# Patient Record
Sex: Male | Born: 1970 | Race: White | Hispanic: No | Marital: Married | Smoking: Never smoker
Health system: Southern US, Community
[De-identification: ages and names within clinical notes are randomized; demographics above are authoritative.]

---

## 2016-01-12 ENCOUNTER — Ambulatory Visit (INDEPENDENT_AMBULATORY_CARE_PROVIDER_SITE_OTHER): Payer: BLUE CROSS/BLUE SHIELD | Admitting: Sports Medicine

## 2016-01-12 ENCOUNTER — Encounter: Payer: Self-pay | Admitting: Sports Medicine

## 2016-01-12 DIAGNOSIS — S76011A Strain of muscle, fascia and tendon of right hip, initial encounter: Secondary | ICD-10-CM | POA: Insufficient documentation

## 2016-01-12 DIAGNOSIS — S448X2A Injury of other nerves at shoulder and upper arm level, left arm, initial encounter: Secondary | ICD-10-CM | POA: Diagnosis not present

## 2016-01-12 NOTE — Progress Notes (Signed)
Chief complaint -- left scapular pain  Patient is a regular weightlifter In 2015 he had biceps tenodesis to the left shoulder by Dr. Carney LivingSpeer Had done very well and he returned to normal lifting Over the past year he feels that he gets sharp pains around his left shoulder blade This is worse with overhead lifts He finds left shoulder rotates forward and clicks at times Pain will sometimes bother him  With computer work Pain usually comes today after he lifts weights  Social history nonsmoker  Review of systems Neck feels tight at times but no pain No radicular symptoms into the left or right arm Occasional night pain  Gen. Muscular white male in no acute distress BP 136/87   Ht 5\' 10"  (1.778 m)   Wt 217 lb (98.4 kg)   BMI 31.14 kg/m   Shoulder: Inspection reveals no abnormalities, atrophy or asymmetry. Palpation is normal with no tenderness over AC joint or bicipital groove. ROM is full in all planes. Rotator cuff strength normal throughout. No signs of impingement with negative Neer and Hawkin's tests, empty can. Speeds and Yergason's tests normal. No labral pathology noted with negative Obrien's, negative clunk and good stability. Excess motion of left scapula observed With repeated movement No painful arc and no drop arm sign. No apprehension sign  Scapular stabilization sign reduces symptoms  When he is abducted and internally rotated he demonstrates weakness on external rotation  Positive Tinels sign with brachial plexus  Neck evaluation was unremarkable  Ultrasound of left shoulder Rotator cuff tendons were evaluated and all were normal Biceps tendon appears normal and is attached about 2 cm below the rotator cuff A.c. Joint is unremarkable No cyst noted at the supraglenoid notch  Impression was an unremarkable shoulder ultrasound  Ultrasound and interpretation by Sibyl ParrKarl B. Darrick PennaFields, MD

## 2016-01-12 NOTE — Assessment & Plan Note (Signed)
I gave him a series of hip abduction exercises  He has significant weakness today  Reevaluate in 8 weeks

## 2016-01-12 NOTE — Assessment & Plan Note (Signed)
I given him a series of scapular stabilization exercises Modify posture Change position when working at the computer Caution with any overhead lifts  Try this and reevaluate in 2 months If not responding we will consider trying gabapentin

## 2016-03-14 ENCOUNTER — Ambulatory Visit: Payer: BLUE CROSS/BLUE SHIELD | Admitting: Sports Medicine

## 2016-03-15 ENCOUNTER — Ambulatory Visit: Payer: BLUE CROSS/BLUE SHIELD | Admitting: Sports Medicine

## 2016-03-16 ENCOUNTER — Ambulatory Visit: Payer: BLUE CROSS/BLUE SHIELD | Admitting: Sports Medicine

## 2016-03-30 ENCOUNTER — Ambulatory Visit (INDEPENDENT_AMBULATORY_CARE_PROVIDER_SITE_OTHER): Payer: BLUE CROSS/BLUE SHIELD | Admitting: Sports Medicine

## 2016-03-30 ENCOUNTER — Encounter: Payer: Self-pay | Admitting: Sports Medicine

## 2016-03-30 VITALS — BP 117/92 | HR 82 | Ht 71.0 in | Wt 217.0 lb

## 2016-03-30 DIAGNOSIS — M25512 Pain in left shoulder: Secondary | ICD-10-CM

## 2016-03-30 DIAGNOSIS — S448X2A Injury of other nerves at shoulder and upper arm level, left arm, initial encounter: Secondary | ICD-10-CM

## 2016-03-30 MED ORDER — CYCLOBENZAPRINE HCL 10 MG PO TABS
10.0000 mg | ORAL_TABLET | Freq: Every evening | ORAL | 0 refills | Status: DC | PRN
Start: 2016-03-30 — End: 2016-07-06

## 2016-03-30 NOTE — Progress Notes (Signed)
  Subjective:    William Padilla is a 46 y.o. old male here for follow up on right shoulder blade pain.  HPI Pain over right shoulder blade: reports some improvement with the exercise regiemen he was given when he was last seen here. He was doing fine until a month ago when he had a setback doing overhead lifting. He reports feeling brief pain over the medial border of the scapula and upper thoracic spine. He continued to feel this pain intermittently. Felt pain driving with that arm. He went to chiropractors about 2 weeks ago. He goes to chiropractics every month. He hasn't seen much improvement with this. He tried naproxen intermittently without improvement either. He works on the computer most of the days. He does some stretching intermittently at work.  Overall, he reports some improvement - particularly in strength - since he was here last time.  PMH/Problem List: has Suprascapular nerve injury, left, initial encounter and Strain of gluteus medius of right lower extremity on his problem list.   has no past medical history on file.  FH:  No family history on file.  SH Social History  Substance Use Topics  . Smoking status: Never Smoker  . Smokeless tobacco: Never Used  . Alcohol use Not on file    Review of Systems Review of systems negative except for pertinent positives and negatives in history of present illness above.     Objective:     Vitals:   03/30/16 0915  BP: (!) 117/92  Pulse: 82  Weight: 217 lb (98.4 kg)  Height: 5\' 11"  (1.803 m)    Physical Exam GEN: appears well, no apparent distress. RESP: speaks in full sentence, no IWOB MSK:  Shoulder: No bony deformities, inflammation/apparent skin lesion, or tenderness in rotator cuff, biceps tendon, or acromioclavicular joint. Full ROM and strength in shoulder upon adduction, abduction, internal and external rotation.  Continued to have bilateral shoulder protraction.  Felt some pain with left shoulder abduction that has  improved with scapular stabilization.  No impingement sign. Empty can and drop arm tests were negative.  SKIN: no apparent skin lesion PSYCH: euthymic mood with congruent affect     Assessment and Plan:  Suprascapular nerve injury, left, initial encounter Improved except for a mild setback he had with overhead lifting about a month ago. Advised to be cautious with overhead lifting. Recommended continuing scapular stabilization and H&T exercises. Gave prescription for Flexeril 10 mg at bedtime as needed pain. Follow-up in 3 months.   Return in about 3 months (around 06/30/2016) for Follow up on shoulder pain.  William Herculesaye T Gonfa, MD 03/30/16 Pager: [phone]  I observed and examined the patient with the resident and agree with assessment and plan.  Note reviewed and modified by me. William BaasKarl Fields, MD

## 2016-03-30 NOTE — Patient Instructions (Addendum)
It is nice seeing you today! It seems your shoulder pain and strength have improved.  The followings are our recommendation going forward: 1. Continue scapular stabilization exercises 2. You can try H&T exercises as we demonstrated to you 3. Fill the prescription for Flexeril and try at bedtime if you continues to have pain.  4. Be cautious with overhead lifting exercises  Follow up in three months.

## 2016-03-30 NOTE — Assessment & Plan Note (Signed)
Improved except for a mild setback he had with overhead lifting about a month ago. Advised to be cautious with overhead lifting. Recommended continuing scapular stabilization and H&T exercises. Gave prescription for Flexeril 10 mg at bedtime as needed pain. Follow-up in 3 months.

## 2016-07-06 ENCOUNTER — Telehealth: Payer: Self-pay | Admitting: Student

## 2016-07-06 ENCOUNTER — Ambulatory Visit
Admission: RE | Admit: 2016-07-06 | Discharge: 2016-07-06 | Disposition: A | Discharge status: Home / Self Care | Payer: BLUE CROSS/BLUE SHIELD | Source: Ambulatory Visit | Attending: Sports Medicine | Admitting: Sports Medicine

## 2016-07-06 ENCOUNTER — Encounter: Payer: Self-pay | Admitting: Sports Medicine

## 2016-07-06 ENCOUNTER — Ambulatory Visit (INDEPENDENT_AMBULATORY_CARE_PROVIDER_SITE_OTHER): Payer: BLUE CROSS/BLUE SHIELD | Admitting: Sports Medicine

## 2016-07-06 DIAGNOSIS — G2589 Other specified extrapyramidal and movement disorders: Secondary | ICD-10-CM

## 2016-07-06 DIAGNOSIS — M542 Cervicalgia: Secondary | ICD-10-CM

## 2016-07-06 MED ORDER — AMITRIPTYLINE HCL 25 MG PO TABS: 25.0000 mg | ORAL_TABLET | Freq: Every day | ORAL | 1 refills | Status: AC

## 2016-07-06 NOTE — Telephone Encounter (Signed)
Called pt, verified name and DOB.  Reviewed neck x-rays, which were normal.  Recommended continuing plan and follow up if not improved in 6 weeks.  Signed,  Corliss MarcusAlicia Barnes, DO [facility] 4:23 PM

## 2016-07-06 NOTE — Assessment & Plan Note (Signed)
Continue HEP, much improved from last visit

## 2016-07-06 NOTE — Progress Notes (Signed)
  William PulseJames Padilla - 46 y.o. male MRN [id number]  Date of birth: 04/16/1970  SUBJECTIVE:  Including CC & ROS.  CC: left shoulder pain, upper back pain, neck pain   Presents in follow-up for left shoulder pain, or back pain, neck pain. He reports that his left shoulder is doing much better after his home exercise program.  He uses Flexeril as needed and states that it takes the edge off. While he is doing his home exercise program, he feels as though his upper back has been acting up. He states that it is feeling really tight and it is on his left side more than his right he denies any radicular pain, numbness, tingling, weakness. He also complains of pain in his neck that is been ongoing for the past few months. Upper back pain has been ongoing for the past 6 weeks.  ROS: No unexpected weight loss, fever, chills, swelling, instability, muscle pain, numbness/tingling, redness, otherwise see HPI   PMHx - Updated and reviewed.  Contributory factors include: hypothyroidism PSHx - Updated and reviewed.  Contributory factors include:  Negative FHx - Updated and reviewed.  Contributory factors include:  Negative Social Hx - Updated and reviewed. Contributory factors include: Negative Medications - reviewed, takes supplements  DATA REVIEWED: Previous office visits- suprascapular nerve injury  PHYSICAL EXAM:  VS: BP:110/70  HR: bpm  TEMP: ( )  RESP:   HT:5\' 11"  (180.3 cm)   WT:217 lb (98.4 kg)  BMI:30.3 PHYSICAL EXAM: Gen: NAD, alert, cooperative with exam, well-appearing HEENT: clear conjunctiva,  CV:  no edema, capillary refill brisk, normal rate Resp: non-labored Skin: no rashes, normal turgor  Neuro: no gross deficits.  Psych:  alert and oriented Neck: Inspection unremarkable. No palpable stepoffs. TTP over L>R trapezius Negative Spurling's maneuver. Full neck range of motion Grip strength and sensation normal in bilateral hands Strength good C4 to T1 distribution No sensory change  to C4 to T1 Negative Hoffman sign bilaterally Reflexes normal  TTP at paraspinals L>R in upper back  Shoulder: Inspection reveals no abnormalities, atrophy or asymmetry. Palpation is normal with no tenderness over AC joint or bicipital groove. ROM is full in all planes. Rotator cuff strength normal throughout. No signs of impingement with negative Neer and Hawkin's tests, empty can sign. Speeds and Yergason's tests normal. No labral pathology noted with negative Obrien's, negative clunk and good stability. Normal scapular function observed, but does have slightly protruding inferior base of left scapula. No painful arc and no drop arm sign. No apprehension sign     ASSESSMENT & PLAN:   Neck pain Will check neck x-rays, start amitriptyline at night.  Will call with results.  Follow up 2 months to reassess  Scapular dyskinesis Continue HEP, much improved from last visit

## 2016-07-06 NOTE — Telephone Encounter (Signed)
Called pt and left message to call back to review x-rays.  If calls back, x-rays negative.  Would recommend continuing plan with amitriptyline and HEP.  If not improved in 6-8 weeks, would like to see in office again to reassess.  May consider MRI at that time.  Signed,  Corliss MarcusAlicia Barnes, DO [facility] 3:34 PM

## 2016-07-06 NOTE — Assessment & Plan Note (Addendum)
Will check neck x-rays, start amitriptyline at night.  Will call with results.  Follow up 2 months to reassess

## 2016-09-14 ENCOUNTER — Ambulatory Visit: Payer: BLUE CROSS/BLUE SHIELD | Admitting: Sports Medicine

## 2016-10-05 ENCOUNTER — Ambulatory Visit (INDEPENDENT_AMBULATORY_CARE_PROVIDER_SITE_OTHER): Payer: BLUE CROSS/BLUE SHIELD | Admitting: Sports Medicine

## 2016-10-05 DIAGNOSIS — S448X2A Injury of other nerves at shoulder and upper arm level, left arm, initial encounter: Secondary | ICD-10-CM | POA: Diagnosis not present

## 2016-10-05 MED ORDER — CYCLOBENZAPRINE HCL 10 MG PO TABS: ORAL_TABLET | ORAL | 1 refills | Status: AC

## 2016-10-05 NOTE — Patient Instructions (Addendum)
Complete the following exercises daily:  Arm swings by your side 3 x 30  Easy neck motion in all directions  Shoulder shrugs 3 x 30  Complete seated upper body lift with dumbbells and neck supported for the next few weeks.

## 2016-10-06 NOTE — Assessment & Plan Note (Signed)
I think this is recurrent Must avoid head position forward with lifting Avoid weight bar on back of neck No overhead press  Flexeril 10 up to tid  If not better in 4 wks consider neck MRI  Keep up scap exer

## 2016-10-06 NOTE — Progress Notes (Signed)
CC: left shoulder pain  Patient increased weight from 120 to 180 lbs on squat Holds bar behind neck Now has persistent pain into left shoulder, trap and shoulder blade He has had this off and on for years Still doing scap stabilization exercises and better posture XR of neck did not show DJD  Pain now is stopping him from working out In past flexeril helped NSAIDs and pain med did not  ROS No numbness or tingling into hand No weakness in left arm  PE Muscular w M in NAD BP 124/74   Ht  (1.778 m)   Wt 215 lb (97.5 kg)   BMI 30.85 kg/m   Neck: full ROM but pain with: Left lateral bend With full flexion With left rotation  RC strength is good throughout Full ROM RT shoulder  Mild winging of left scapula and crepitation with motion Spasm left trapezius  DTR - normal left arm/ diminished at triceps

## 2017-03-27 ENCOUNTER — Encounter: Payer: Self-pay | Admitting: Sports Medicine

## 2017-03-27 ENCOUNTER — Ambulatory Visit (INDEPENDENT_AMBULATORY_CARE_PROVIDER_SITE_OTHER): Payer: BLUE CROSS/BLUE SHIELD | Admitting: Sports Medicine

## 2017-03-27 VITALS — BP 110/72 | Ht 70.0 in | Wt 215.0 lb

## 2017-03-27 DIAGNOSIS — S46811A Strain of other muscles, fascia and tendons at shoulder and upper arm level, right arm, initial encounter: Secondary | ICD-10-CM | POA: Diagnosis not present

## 2017-03-27 DIAGNOSIS — S448X2S Injury of other nerves at shoulder and upper arm level, left arm, sequela: Secondary | ICD-10-CM | POA: Diagnosis not present

## 2017-03-27 NOTE — Progress Notes (Signed)
Chief complaint: Follow-up of left shoulder pain 6-8 months; new right shoulder pain x 2 months  History of present illness: Avik is a 47 year old right-hand dominant male presents to the sports medicine office today for follow-up of left shoulder pain.  He was last seen here back in September 2018.  He had left shoulder pain, really noticed pain when he does weights and holds the bar behind his neck.  He reports pain is in the posterior aspect of his left shoulder radiating down to the left trapezius and the scapula.  At last appointment, he was found to have scapular winging.  Ultimately, symptoms were found to be related to suprascapular nerve impingement and stretching. Flexeril was prescribed and he was requested to follow-up his symptoms did not improve and neck MRI would be considered.  He was also discussed to do some scapular exercises.  He reports that symptoms are improved today, but still reports occurrence of pain when holding weights with the bar behind his neck.  He does not report of any numbness, tingling, or burning paresthesias radiating down his left arm into his left hand and fingers.  He does not report feeling weak in his left shoulder.  He does not report of any shoulder pain specifically. He reports that he used Flexeril for the first couple months, but has not used any medications over the last 2-3 months.  No interval injury or trauma. He reports that he started to get right posterolateral shoulder pain about 2 months ago. He does not report of any inciting incident, trauma, or injury.  He does not report of pain radiating pain down his right arm into his right hand and fingers.  He does not report of any overlying erythema, ecchymosis, or effusion.  He reports any type of extreme abduction or external range of motion causes pain.  Review of systems:  As stated above  Interval past medical history, surgical history, family history, and social history obtained and unchanged. Past  medical history notable for hypothyroidism, does not report of any surgery/hospitalizations, does not report of any current tobacco use, family history non-contributable to orthopedic complaint.  Allergies and medications reviewed, updated in EMR.  Physical exam: Vital signs are reviewed and are documented in the chart Gen.: Alert, oriented, appears stated age, in no apparent distress HEENT: Moist oral mucosa Respiratory: Normal respirations, able to speak in full sentences Cardiac: Regular rate, distal pulses 2+ Integumentary: No rashes on visible skin:  Neurologic: He does have great rotator cuff strength, would categorize rotator cuff strength on both sides that is 5/5, no evidence of scapular winging or scapular retraction, sensation 2+ in bilateral upper extremities Psych: Normal affect, mood is described as good Musculoskeletal: Inspection of left shoulder reveals no obvious deformity or muscle atrophy, no warmth, erythema, ecchymosis, or effusion no tenderness to palpation anywhere in his left shoulder today including the distal clavicle, AC joint, acromion, scapular spine, cervical spine, paracervical spinous processes, he is slightly tender to deep palpation over the posterior lateral aspect of his deltoid, no tenderness over the right distal clavicle, AC joint, acromion biceps, triceps, scapular spine, he does have full shoulder range of motion without any elicitation of pain, rotator cuff impingement testing is negative on the left side, negative on the right side, however, when supraspinatus is isolated he does have minimal signs of impingement and feels pain with resistance  Assessment and plan: 1. Resolving left suprascapular nerve impingement from being overstretched 2. Right supraspinatus tendon strain, with recruitment and  easily fatiguing of the deltoid musculature  Plan: Discussed that his symptoms are improving on the left side as he does not any evidence of scapular winging or  protrusion noted.  Discussed that he does not need to continue Flexeril unless he feels muscle spasm.  Discussed avoiding aggravating activities such as overhead activities with the weight bar.  Discussed some home exercise program for him to do that can focus on forward flexion exercises of the shoulder.  In addition, discussed that he does have some supraspinatus tendon strain issues on the right shoulder that could be resolved with some [place] exercises and doing some strengthening of the supraspinatus and infraspinatus musculature.   Discussed to give this 4-6 weeks.  If no improvement in symptoms return to office.  Otherwise, plan to have him return on as-needed basis.   Haynes Kernshristopher Lake, M.D. Primary Care Sports Medicine Fellow [facility]  I observed and examined the patient with the [facility] resident and agree with assessment and plan.  Note reviewed and modified by me. Enid BaasKarl Fields, MD

## 2017-09-18 DIAGNOSIS — L814 Other melanin hyperpigmentation: Secondary | ICD-10-CM | POA: Diagnosis not present

## 2017-09-18 DIAGNOSIS — L923 Foreign body granuloma of the skin and subcutaneous tissue: Secondary | ICD-10-CM | POA: Diagnosis not present

## 2017-09-18 DIAGNOSIS — L821 Other seborrheic keratosis: Secondary | ICD-10-CM | POA: Diagnosis not present

## 2017-09-18 DIAGNOSIS — D2272 Melanocytic nevi of left lower limb, including hip: Secondary | ICD-10-CM | POA: Diagnosis not present

## 2017-10-08 DIAGNOSIS — M25511 Pain in right shoulder: Secondary | ICD-10-CM | POA: Diagnosis not present

## 2018-01-15 DIAGNOSIS — M25511 Pain in right shoulder: Secondary | ICD-10-CM | POA: Diagnosis not present

## 2018-01-15 DIAGNOSIS — M7521 Bicipital tendinitis, right shoulder: Secondary | ICD-10-CM | POA: Diagnosis not present

## 2018-02-01 DIAGNOSIS — S46011A Strain of muscle(s) and tendon(s) of the rotator cuff of right shoulder, initial encounter: Secondary | ICD-10-CM | POA: Diagnosis not present

## 2018-02-01 DIAGNOSIS — M67911 Unspecified disorder of synovium and tendon, right shoulder: Secondary | ICD-10-CM | POA: Diagnosis not present

## 2018-02-14 DIAGNOSIS — M7541 Impingement syndrome of right shoulder: Secondary | ICD-10-CM | POA: Diagnosis not present

## 2018-03-02 DIAGNOSIS — M25511 Pain in right shoulder: Secondary | ICD-10-CM | POA: Diagnosis not present

## 2018-03-02 DIAGNOSIS — S43401A Unspecified sprain of right shoulder joint, initial encounter: Secondary | ICD-10-CM | POA: Diagnosis not present

## 2018-03-02 DIAGNOSIS — M24111 Other articular cartilage disorders, right shoulder: Secondary | ICD-10-CM | POA: Diagnosis not present

## 2018-03-02 DIAGNOSIS — G8918 Other acute postprocedural pain: Secondary | ICD-10-CM | POA: Diagnosis not present

## 2018-03-02 DIAGNOSIS — M7521 Bicipital tendinitis, right shoulder: Secondary | ICD-10-CM | POA: Diagnosis not present

## 2018-03-02 DIAGNOSIS — M25711 Osteophyte, right shoulder: Secondary | ICD-10-CM | POA: Diagnosis not present

## 2018-03-02 DIAGNOSIS — X58XXXA Exposure to other specified factors, initial encounter: Secondary | ICD-10-CM | POA: Diagnosis not present

## 2018-03-02 DIAGNOSIS — M19011 Primary osteoarthritis, right shoulder: Secondary | ICD-10-CM | POA: Diagnosis not present

## 2018-03-02 DIAGNOSIS — M7541 Impingement syndrome of right shoulder: Secondary | ICD-10-CM | POA: Diagnosis not present

## 2018-03-02 DIAGNOSIS — M75111 Incomplete rotator cuff tear or rupture of right shoulder, not specified as traumatic: Secondary | ICD-10-CM | POA: Diagnosis not present

## 2018-03-06 DIAGNOSIS — M25511 Pain in right shoulder: Secondary | ICD-10-CM | POA: Diagnosis not present

## 2018-03-06 IMAGING — DX DG CERVICAL SPINE COMPLETE 4+V
5 series · 5 of 5 positions shown · non-contrast
Comparison: None in PACs

CLINICAL DATA: Left-sided neck and shoulder pain with hand numbness
since left shoulder surgery 2 years ago. No known injury.

EXAM:
CERVICAL SPINE - COMPLETE 4+ VIEW

[dg cervical spine complete (1 of 5)]
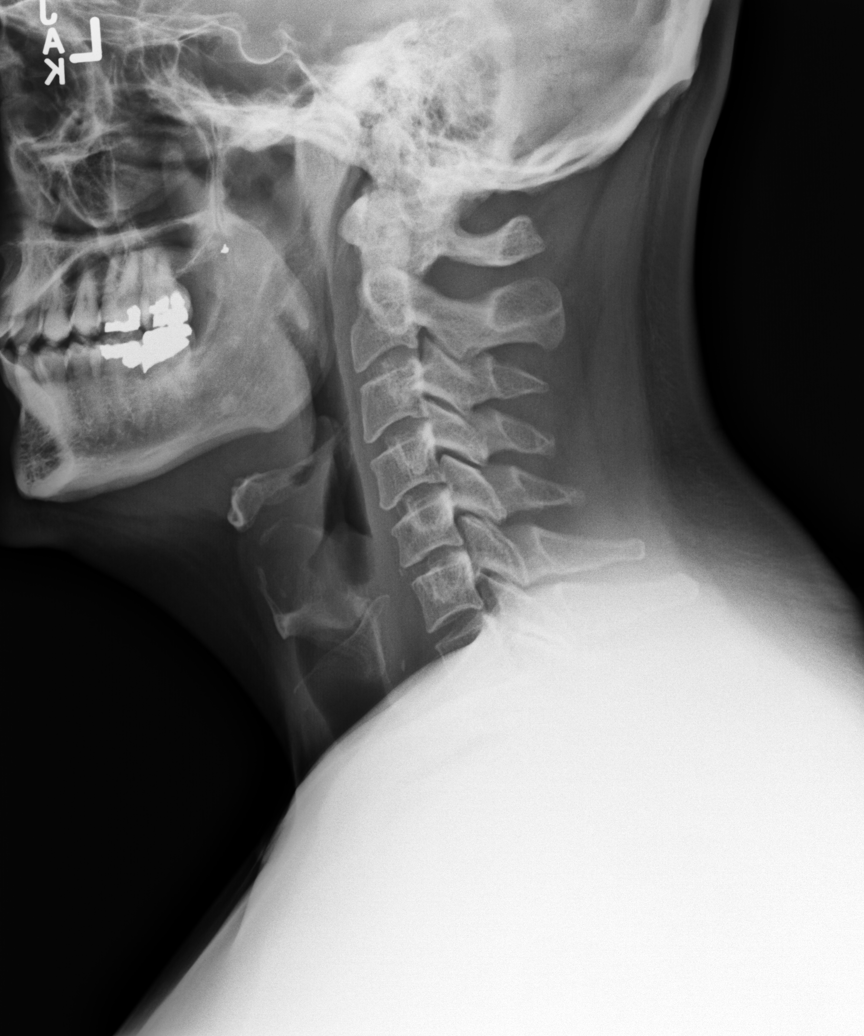

[dg cervical spine complete (2 of 5)]
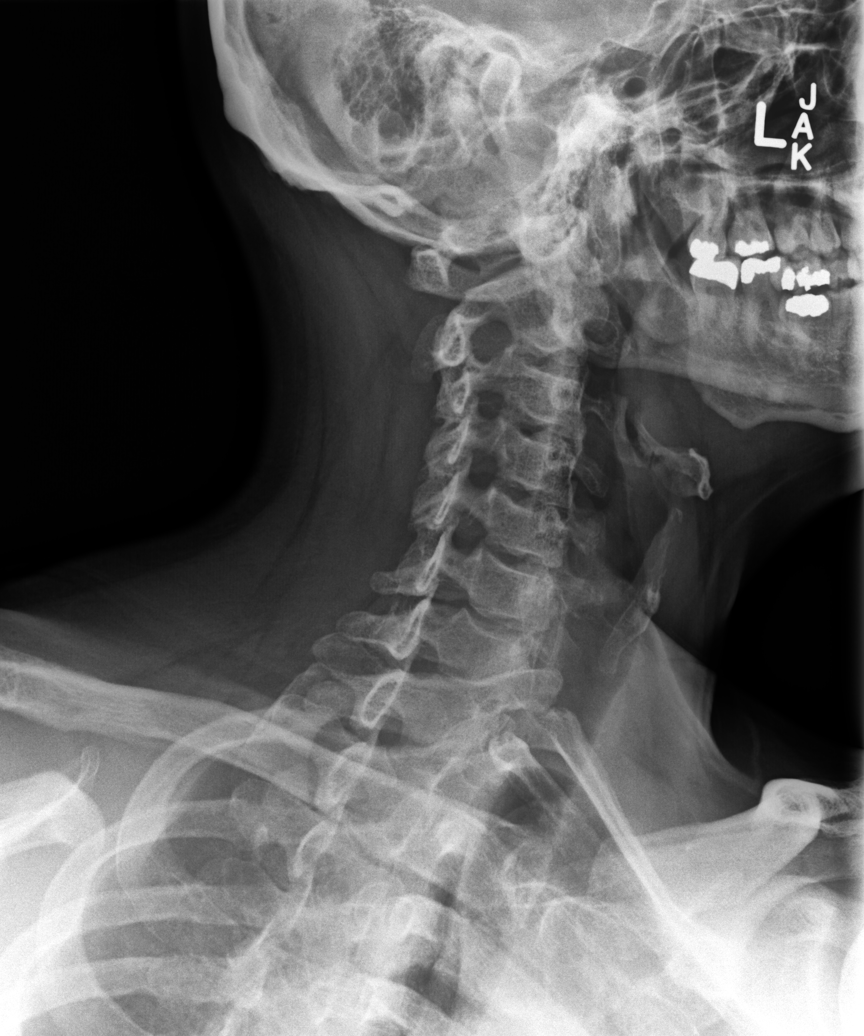

[dg cervical spine complete (3 of 5)]
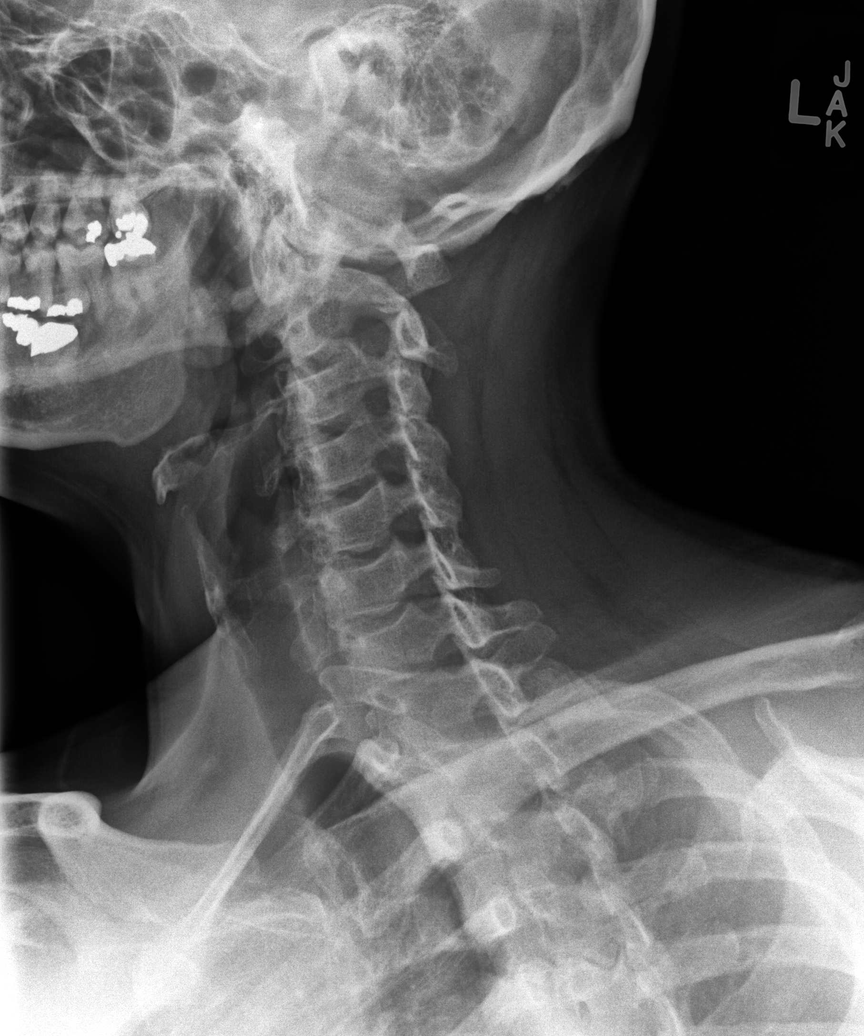

[dg cervical spine complete (4 of 5)]
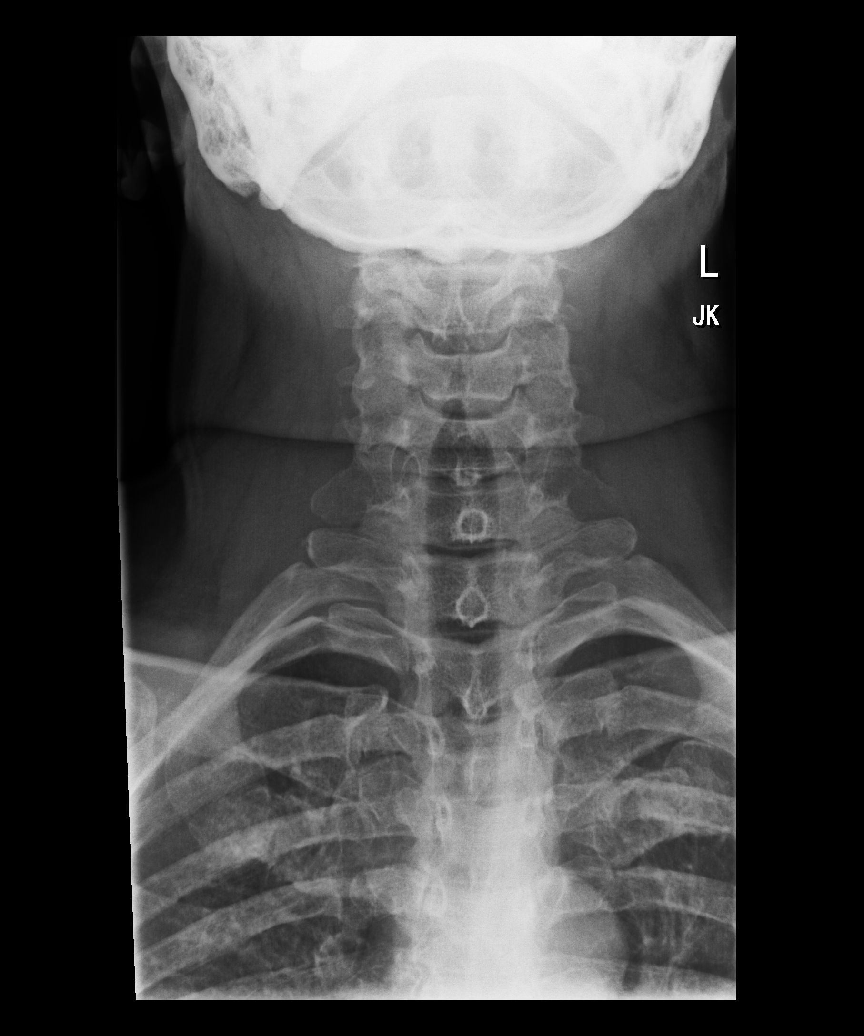

[dg cervical spine complete (5 of 5)]
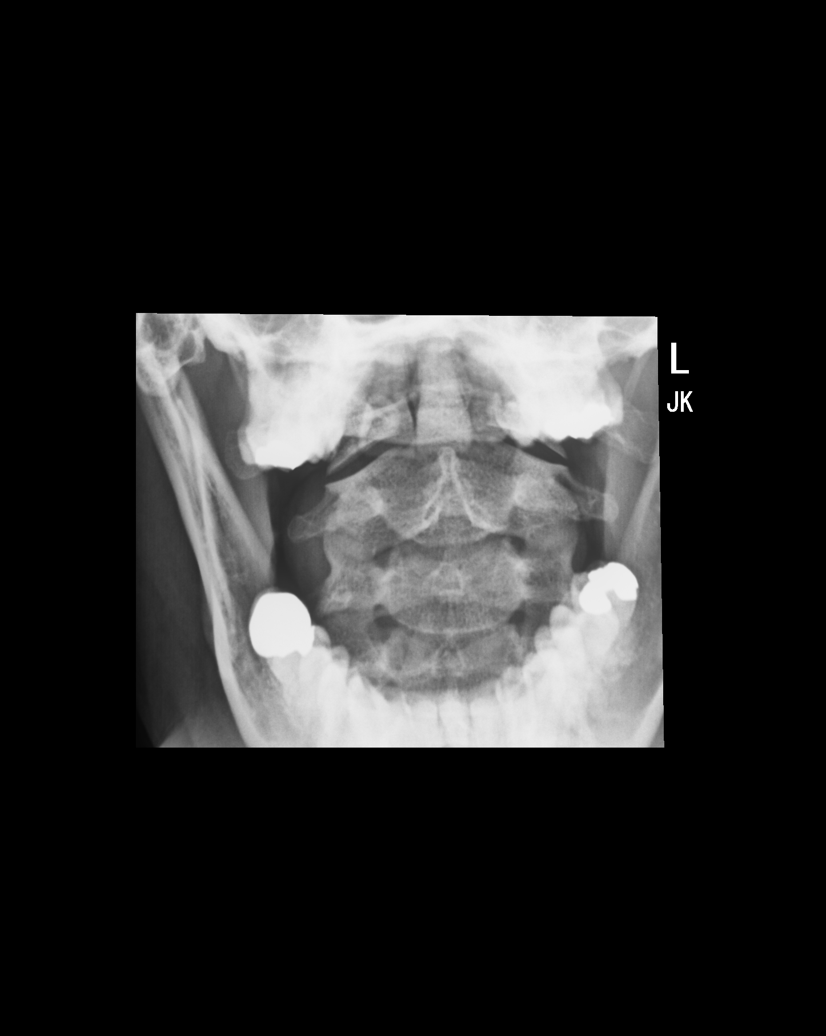

[5 of 5 positions shown; findings below may reference images not displayed]

FINDINGS: The cervical vertebral bodies are preserved in height. The disc
space heights are well maintained. There is no bony encroachment
upon the neural foramina. There is no perched facet. The spinous
processes are normal. The odontoid is intact. The prevertebral soft
tissue spaces are normal.
IMPRESSION: There is no acute or significant chronic bony abnormality of the
cervical spine. If radicular symptoms are suspected clinically,
cervical spine MRI would be a useful next imaging step.

## 2018-03-08 DIAGNOSIS — M25511 Pain in right shoulder: Secondary | ICD-10-CM | POA: Diagnosis not present

## 2018-03-11 DIAGNOSIS — M25511 Pain in right shoulder: Secondary | ICD-10-CM | POA: Diagnosis not present

## 2018-03-13 DIAGNOSIS — M25511 Pain in right shoulder: Secondary | ICD-10-CM | POA: Diagnosis not present

## 2018-03-15 DIAGNOSIS — M25511 Pain in right shoulder: Secondary | ICD-10-CM | POA: Diagnosis not present

## 2018-03-18 DIAGNOSIS — M25511 Pain in right shoulder: Secondary | ICD-10-CM | POA: Diagnosis not present

## 2018-03-20 DIAGNOSIS — M25511 Pain in right shoulder: Secondary | ICD-10-CM | POA: Diagnosis not present

## 2018-03-21 DIAGNOSIS — M25511 Pain in right shoulder: Secondary | ICD-10-CM | POA: Diagnosis not present

## 2018-03-25 DIAGNOSIS — M25511 Pain in right shoulder: Secondary | ICD-10-CM | POA: Diagnosis not present

## 2018-03-27 DIAGNOSIS — M25511 Pain in right shoulder: Secondary | ICD-10-CM | POA: Diagnosis not present

## 2018-03-29 DIAGNOSIS — M25511 Pain in right shoulder: Secondary | ICD-10-CM | POA: Diagnosis not present

## 2018-04-01 DIAGNOSIS — M25511 Pain in right shoulder: Secondary | ICD-10-CM | POA: Diagnosis not present

## 2018-04-03 DIAGNOSIS — M25511 Pain in right shoulder: Secondary | ICD-10-CM | POA: Diagnosis not present

## 2018-04-05 DIAGNOSIS — M25511 Pain in right shoulder: Secondary | ICD-10-CM | POA: Diagnosis not present

## 2018-04-08 DIAGNOSIS — M25511 Pain in right shoulder: Secondary | ICD-10-CM | POA: Diagnosis not present

## 2018-04-10 DIAGNOSIS — M25511 Pain in right shoulder: Secondary | ICD-10-CM | POA: Diagnosis not present

## 2018-04-16 DIAGNOSIS — M25511 Pain in right shoulder: Secondary | ICD-10-CM | POA: Diagnosis not present

## 2018-04-18 DIAGNOSIS — M25511 Pain in right shoulder: Secondary | ICD-10-CM | POA: Diagnosis not present

## 2018-04-24 DIAGNOSIS — M25511 Pain in right shoulder: Secondary | ICD-10-CM | POA: Diagnosis not present

## 2018-05-01 DIAGNOSIS — M25511 Pain in right shoulder: Secondary | ICD-10-CM | POA: Diagnosis not present

## 2018-05-06 DIAGNOSIS — M25511 Pain in right shoulder: Secondary | ICD-10-CM | POA: Diagnosis not present

## 2018-05-20 DIAGNOSIS — M25511 Pain in right shoulder: Secondary | ICD-10-CM | POA: Diagnosis not present

## 2018-08-14 DIAGNOSIS — M1711 Unilateral primary osteoarthritis, right knee: Secondary | ICD-10-CM | POA: Diagnosis not present

## 2018-08-14 DIAGNOSIS — M25561 Pain in right knee: Secondary | ICD-10-CM | POA: Diagnosis not present

## 2018-09-05 DIAGNOSIS — Z0000 Encounter for general adult medical examination without abnormal findings: Secondary | ICD-10-CM | POA: Diagnosis not present

## 2018-09-05 DIAGNOSIS — E785 Hyperlipidemia, unspecified: Secondary | ICD-10-CM | POA: Diagnosis not present

## 2018-09-18 DIAGNOSIS — Z0000 Encounter for general adult medical examination without abnormal findings: Secondary | ICD-10-CM | POA: Diagnosis not present

## 2018-09-18 DIAGNOSIS — E039 Hypothyroidism, unspecified: Secondary | ICD-10-CM | POA: Diagnosis not present

## 2018-09-18 DIAGNOSIS — S99921A Unspecified injury of right foot, initial encounter: Secondary | ICD-10-CM | POA: Diagnosis not present

## 2018-09-18 DIAGNOSIS — S99921D Unspecified injury of right foot, subsequent encounter: Secondary | ICD-10-CM | POA: Diagnosis not present

## 2018-09-18 DIAGNOSIS — E785 Hyperlipidemia, unspecified: Secondary | ICD-10-CM | POA: Diagnosis not present

## 2018-09-18 DIAGNOSIS — F418 Other specified anxiety disorders: Secondary | ICD-10-CM | POA: Diagnosis not present

## 2019-02-26 DIAGNOSIS — M546 Pain in thoracic spine: Secondary | ICD-10-CM | POA: Diagnosis not present

## 2019-02-26 DIAGNOSIS — M542 Cervicalgia: Secondary | ICD-10-CM | POA: Diagnosis not present

## 2019-02-28 DIAGNOSIS — M542 Cervicalgia: Secondary | ICD-10-CM | POA: Diagnosis not present

## 2019-02-28 DIAGNOSIS — M546 Pain in thoracic spine: Secondary | ICD-10-CM | POA: Diagnosis not present

## 2019-03-07 DIAGNOSIS — M546 Pain in thoracic spine: Secondary | ICD-10-CM | POA: Diagnosis not present

## 2019-03-07 DIAGNOSIS — M542 Cervicalgia: Secondary | ICD-10-CM | POA: Diagnosis not present

## 2019-03-13 DIAGNOSIS — M542 Cervicalgia: Secondary | ICD-10-CM | POA: Diagnosis not present

## 2019-03-13 DIAGNOSIS — M546 Pain in thoracic spine: Secondary | ICD-10-CM | POA: Diagnosis not present

## 2019-03-21 DIAGNOSIS — M542 Cervicalgia: Secondary | ICD-10-CM | POA: Diagnosis not present

## 2019-03-21 DIAGNOSIS — M546 Pain in thoracic spine: Secondary | ICD-10-CM | POA: Diagnosis not present

## 2019-03-28 DIAGNOSIS — M542 Cervicalgia: Secondary | ICD-10-CM | POA: Diagnosis not present

## 2019-03-28 DIAGNOSIS — M546 Pain in thoracic spine: Secondary | ICD-10-CM | POA: Diagnosis not present

## 2019-04-04 DIAGNOSIS — M546 Pain in thoracic spine: Secondary | ICD-10-CM | POA: Diagnosis not present

## 2019-04-04 DIAGNOSIS — M542 Cervicalgia: Secondary | ICD-10-CM | POA: Diagnosis not present

## 2019-04-11 DIAGNOSIS — M546 Pain in thoracic spine: Secondary | ICD-10-CM | POA: Diagnosis not present

## 2019-04-11 DIAGNOSIS — M542 Cervicalgia: Secondary | ICD-10-CM | POA: Diagnosis not present

## 2019-04-22 DIAGNOSIS — M546 Pain in thoracic spine: Secondary | ICD-10-CM | POA: Diagnosis not present

## 2019-04-22 DIAGNOSIS — M542 Cervicalgia: Secondary | ICD-10-CM | POA: Diagnosis not present

## 2019-05-09 DIAGNOSIS — M542 Cervicalgia: Secondary | ICD-10-CM | POA: Diagnosis not present

## 2019-05-09 DIAGNOSIS — M546 Pain in thoracic spine: Secondary | ICD-10-CM | POA: Diagnosis not present

## 2019-05-23 DIAGNOSIS — M546 Pain in thoracic spine: Secondary | ICD-10-CM | POA: Diagnosis not present

## 2019-05-23 DIAGNOSIS — M542 Cervicalgia: Secondary | ICD-10-CM | POA: Diagnosis not present

## 2019-06-10 ENCOUNTER — Other Ambulatory Visit: Payer: Self-pay

## 2019-06-10 ENCOUNTER — Ambulatory Visit (INDEPENDENT_AMBULATORY_CARE_PROVIDER_SITE_OTHER): Payer: BC Managed Care – PPO | Admitting: Sports Medicine

## 2019-06-10 ENCOUNTER — Encounter: Payer: Self-pay | Admitting: Sports Medicine

## 2019-06-10 DIAGNOSIS — S448X2A Injury of other nerves at shoulder and upper arm level, left arm, initial encounter: Secondary | ICD-10-CM

## 2019-06-10 MED ORDER — GABAPENTIN 300 MG PO CAPS: 300.0000 mg | ORAL_CAPSULE | Freq: Every day | ORAL | 1 refills | Status: DC

## 2019-06-10 NOTE — Assessment & Plan Note (Signed)
This has recurred Not changed by his biceps and RC surgery  Cont PT Trial of gabapentin at HS Consder MRI if not resolving

## 2019-06-10 NOTE — Progress Notes (Signed)
   [facility] [address] , [place] [address] Phone: [phone] Fax: [phone]   Patient Name: William Padilla Date of Birth: 1970/10/22 Medical Record Number: [id number] Gender: male Date of Encounter: 06/10/2019  SUBJECTIVE:      Chief Complaint:  Bilateral scapular pain   HPI:  William Padilla is a 49 year old RHD M presenting with bilateral scapular pain.  We have seen him for left suprascapular nerve injury in the past.  Since we have seen him he has had bilateral shoulder surgery, biceps tenodesis with rotator cuff.  He is working with a physical therapist who was concerned for possible neck involvement given the symptoms.  He has pain with protraction of his scapula, left worse than right.  Denies any numbness or tingling into his fingertips.  No new injury.     ROS:     See HPI.   PERTINENT  PMH / PSH / FH / SH:  Past Medical, Surgical, Social, and Family History Reviewed & Updated in the EMR. Pertinent findings include:  Allergy to codeine and Norco, lateral shoulder surgery, scapular dyskinesis   OBJECTIVE:  BP 118/70   Ht 5\' 10"  (1.778 m)   Wt 215 lb (97.5 kg)   BMI 30.85 kg/m  Physical Exam:  Vital signs are reviewed.   GEN: Alert and oriented, NAD Pulm: Breathing unlabored PSY: normal mood, congruent affect  MSK: Bilateral shoulder Well developed, well nourished, in no acute distress. No swelling, ecchymoses.  No gross deformity. No TTP. FROM. Strength 5/5 with empty can and resisted internal/external rotation. Negative Hawkins, Neers. Negative Yergasons. Negative apprehension. Left scapula moderately protracted compared to right scapula No dyskinetic movement noted with elevation repeated NV intact distally. Spasm of left trapezius Good neck ROM but does feel tightness radiate down to trapezius  Neck No gross deformity, swelling, bruising. No midline/bony TTP. FROM. BUE strength 5/5.   Sensation intact to  light touch.   2+ equal reflexes in triceps, biceps, brachioradialis tendons. Negative spurlings. NV intact distal BUEs.    ASSESSMENT & PLAN:   1. Left scapular protraction  Overall, it appears this is again irritation of the suprascapular nerve given the amount of protraction on exam.  Recommended patient continue with physical therapy.  We have given him handouts for TE stretches, scapular exercises, upright row, and started gabapentin 300 mg nightly as needed.  Given that this has resolved in the past, we are hopeful getting aggressive with therapy over the next 4-6 weeks will help.  He can follow-up with at that time, if there is minimal to no improvement we can consider MRI of his cervical spine.   [place], DO, [facility] Sports Medicine Fellow  I observed and examined the patient with Dr. Judge Stall and agree with assessment and plan.  Note reviewed and modified by me. Ruta Hinds, MD  Addendum: Noted tightness in RT ITB but good hip strength.  Given ITB stretches KBF

## 2019-06-13 DIAGNOSIS — M546 Pain in thoracic spine: Secondary | ICD-10-CM | POA: Diagnosis not present

## 2019-06-13 DIAGNOSIS — M542 Cervicalgia: Secondary | ICD-10-CM | POA: Diagnosis not present

## 2019-06-27 DIAGNOSIS — M546 Pain in thoracic spine: Secondary | ICD-10-CM | POA: Diagnosis not present

## 2019-06-27 DIAGNOSIS — M542 Cervicalgia: Secondary | ICD-10-CM | POA: Diagnosis not present

## 2019-07-07 DIAGNOSIS — J301 Allergic rhinitis due to pollen: Secondary | ICD-10-CM | POA: Diagnosis not present

## 2019-07-24 ENCOUNTER — Ambulatory Visit: Payer: BC Managed Care – PPO | Admitting: Sports Medicine

## 2019-07-24 DIAGNOSIS — M542 Cervicalgia: Secondary | ICD-10-CM | POA: Diagnosis not present

## 2019-07-24 DIAGNOSIS — M546 Pain in thoracic spine: Secondary | ICD-10-CM | POA: Diagnosis not present

## 2019-07-31 ENCOUNTER — Ambulatory Visit (INDEPENDENT_AMBULATORY_CARE_PROVIDER_SITE_OTHER): Payer: BC Managed Care – PPO | Admitting: Sports Medicine

## 2019-07-31 ENCOUNTER — Other Ambulatory Visit: Payer: Self-pay

## 2019-07-31 DIAGNOSIS — S448X2A Injury of other nerves at shoulder and upper arm level, left arm, initial encounter: Secondary | ICD-10-CM | POA: Diagnosis not present

## 2019-07-31 MED ORDER — GABAPENTIN 300 MG PO CAPS: 300.0000 mg | ORAL_CAPSULE | Freq: Three times a day (TID) | ORAL | 6 refills | Status: DC

## 2019-07-31 NOTE — Progress Notes (Signed)
CC: F/U suprascapular N with shoulder pain  Patient with supspected SS N neurapraxia Has spasm and scapular retraction when seen on 5/11 He notes significant benefit on only 300 gabapentin at HS Back lifting weights Sleeping better Notes tightness but minimal pain  He notes that certain exercises with weights can flare his spasm and pain  ROS No neck pain No weakness or tingling into hands or arms  PE Strong appearing WM in nad BP 118/78   Ht 5\' 10"  (1.778 m)   Wt 215 lb (97.5 kg)   BMI 30.85 kg/m   Shoulder: left Inspection reveals no abnormalities, atrophy or asymmetry. Palpation is normal with no tenderness over AC joint or bicipital groove. ROM is full in all planes. Rotator cuff strength normal throughout. No signs of impingement with negative Neer and Hawkin's tests, empty can. Speeds and Yergason's tests normal. No labral pathology noted with negative Obrien's, negative clunk and good stability. Normal scapular function observed. No painful arc and no drop arm sign. No apprehension sign  No spasm noted today His scapular protraction has completely resolved on exam today

## 2019-07-31 NOTE — Assessment & Plan Note (Signed)
Much improved on low dose gabapentin  I am going to allow him to increase gabapentin to tid if needed to deal with flares or bad days.  If he stays pain free for 1 month we will start weaning process  Reck 3 mos

## 2019-08-15 DIAGNOSIS — M546 Pain in thoracic spine: Secondary | ICD-10-CM | POA: Diagnosis not present

## 2019-08-15 DIAGNOSIS — M542 Cervicalgia: Secondary | ICD-10-CM | POA: Diagnosis not present

## 2019-08-25 DIAGNOSIS — M542 Cervicalgia: Secondary | ICD-10-CM | POA: Diagnosis not present

## 2019-08-25 DIAGNOSIS — M546 Pain in thoracic spine: Secondary | ICD-10-CM | POA: Diagnosis not present

## 2019-09-19 DIAGNOSIS — M1711 Unilateral primary osteoarthritis, right knee: Secondary | ICD-10-CM | POA: Diagnosis not present

## 2019-09-26 DIAGNOSIS — R0683 Snoring: Secondary | ICD-10-CM | POA: Diagnosis not present

## 2019-09-26 DIAGNOSIS — R5383 Other fatigue: Secondary | ICD-10-CM | POA: Diagnosis not present

## 2019-09-26 DIAGNOSIS — E785 Hyperlipidemia, unspecified: Secondary | ICD-10-CM | POA: Diagnosis not present

## 2019-09-26 DIAGNOSIS — Z0000 Encounter for general adult medical examination without abnormal findings: Secondary | ICD-10-CM | POA: Diagnosis not present

## 2020-08-02 ENCOUNTER — Other Ambulatory Visit: Payer: Self-pay | Admitting: Sports Medicine

## 2021-03-31 ENCOUNTER — Telehealth (INDEPENDENT_AMBULATORY_CARE_PROVIDER_SITE_OTHER): Payer: BC Managed Care – PPO | Admitting: Sports Medicine

## 2021-03-31 DIAGNOSIS — S448X2A Injury of other nerves at shoulder and upper arm level, left arm, initial encounter: Secondary | ICD-10-CM | POA: Diagnosis not present

## 2021-03-31 MED ORDER — GABAPENTIN 300 MG PO CAPS: 300.0000 mg | ORAL_CAPSULE | Freq: Three times a day (TID) | ORAL | 4 refills | Status: AC

## 2021-03-31 NOTE — Assessment & Plan Note (Signed)
Patient continues to do very well with home exercises and with gabapentin which she takes just at night ?I think he takes 300 to 900 mg at night depending on symptoms ?He denies any side effects with this ?He feels that he is able to function at his normal level and is pleased with the outcome ? ?We will continue him on gabapentin ?Continue to pay attention to posture and some home exercises ?Recheck by phone in about 6 months and I can evaluate him yearly if he is doing well ?

## 2021-03-31 NOTE — Progress Notes (Signed)
Chief complaint neck and left scapular pain ? ?He wishes a video visit today as he is doing well and simply wants to maintain his gabapentin therapy which has controlled his suprascapular nerve injury.  Physician in [facility] office. Patient in business office. Time 15 mins. ? ?Patient states that he has done well ?He did the exercises and no longer has so much spasm around his neck and left shoulder ?He worked on scapular stabilization and feels that his shoulder moves normally now ?He worked on posture ?He has continued iron gabapentin since I first evaluated this in 2016 ?When he is tried to wean off the medication symptoms have returned ?He is able to take the medication just at night ? ?Other medications include Synthroid ?He has been taking pantoprazole for acid reflux ? ?Video exam ?I asked him to stand to observe his posture ?He has had a neck position or more normal ?Shoulder position looks normal ?I had him do some strength testing of C5-T1 nerve levels in his upper extremities ?Strength appeared good in all of these ?
